# Patient Record
Sex: Female | Born: 2000 | Race: Black or African American | Hispanic: No | Marital: Single | Smoking: Never smoker
Health system: Southern US, Community
[De-identification: ages and names within clinical notes are randomized; demographics above are authoritative.]

## PROBLEM LIST (undated history)

## (undated) HISTORY — PX: MOUTH SURGERY: SHX715

---
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
## 2022-05-15 ENCOUNTER — Other Ambulatory Visit: Payer: Self-pay | Admitting: Adult Health

## 2022-05-15 DIAGNOSIS — Z30011 Encounter for initial prescription of contraceptive pills: Secondary | ICD-10-CM

## 2022-08-05 ENCOUNTER — Other Ambulatory Visit: Payer: Self-pay | Admitting: *Deleted

## 2022-08-05 DIAGNOSIS — Z30011 Encounter for initial prescription of contraceptive pills: Secondary | ICD-10-CM

## 2022-08-08 MED ORDER — NEXTSTELLIS 3-14.2 MG PO TABS: 1.0000 | ORAL_TABLET | Freq: Every day | ORAL | 3 refills | Status: DC

## 2022-09-20 ENCOUNTER — Other Ambulatory Visit: Payer: Self-pay | Admitting: *Deleted

## 2022-12-11 ENCOUNTER — Encounter (HOSPITAL_COMMUNITY): Payer: Self-pay | Admitting: Emergency Medicine

## 2022-12-11 ENCOUNTER — Emergency Department (HOSPITAL_COMMUNITY)
Admission: EM | Admit: 2022-12-11 | Discharge: 2022-12-11 | Disposition: A | Discharge status: Home / Self Care | Payer: Medicaid Other | Attending: Emergency Medicine | Admitting: Emergency Medicine

## 2022-12-11 ENCOUNTER — Other Ambulatory Visit: Payer: Self-pay

## 2022-12-11 DIAGNOSIS — X58XXXA Exposure to other specified factors, initial encounter: Secondary | ICD-10-CM | POA: Insufficient documentation

## 2022-12-11 DIAGNOSIS — K029 Dental caries, unspecified: Secondary | ICD-10-CM | POA: Insufficient documentation

## 2022-12-11 DIAGNOSIS — S025XXA Fracture of tooth (traumatic), initial encounter for closed fracture: Secondary | ICD-10-CM | POA: Insufficient documentation

## 2022-12-11 MED ORDER — PENICILLIN V POTASSIUM 250 MG PO TABS
500.0000 mg | ORAL_TABLET | Freq: Once | ORAL | Status: AC
  Administered 2022-12-11: 500 mg via ORAL
  Filled 2022-12-11: qty 2

## 2022-12-11 MED ORDER — PENICILLIN V POTASSIUM 500 MG PO TABS: 500.0000 mg | ORAL_TABLET | Freq: Four times a day (QID) | ORAL | 0 refills | Status: AC

## 2022-12-11 NOTE — ED Triage Notes (Signed)
Pt c/o one of her front upper teeth broke while eating about 30 min ago. She rinsed with salt water after it broke. Tooth in poor condition.No bleeding currently. Denies pain. No dental insurance and no established Education officer, community. B Ellicia Alix RN

## 2022-12-11 NOTE — ED Provider Notes (Signed)
[name] EMERGENCY DEPARTMENT AT [facility] Provider Note   CSN: [id number] Arrival date & time: 12/11/22  0023     History  Chief Complaint  Patient presents with   Dental Injury    [name] is a 22 y.o. female.  22 year old female who presents ER today secondary to a broken tooth.  Her right upper canine broke off.  She states she has a work done previously and she is post to go back to get some other final procedure which she never got done.  She states it broke off last night.  She has no pain.  No fever.  But also has no dentist.  She is not sure what she should do so she presents here for further evaluation.   Dental Injury       Home Medications Prior to Admission medications   Medication Sig Start Date End Date Taking? Authorizing Provider  penicillin v potassium (VEETID) 500 MG tablet Take 1 tablet (500 mg total) by mouth 4 (four) times daily for 10 days. 12/11/22 12/21/22 Yes Kayo Zion, Barbara Cower, MD  Drospirenone-Estetrol (NEXTSTELLIS) 3-14.2 MG TABS Take 1 tablet by mouth daily. 08/08/22   Myna Hidalgo, DO  Drospirenone-Estetrol 3-14.2 MG TABS Take 1 tablet by mouth daily. 05/11/21   Lazaro Arms, MD  Multiple Vitamin (MULTIVITAMIN) tablet Take 1 tablet by mouth daily.    [provider]      Allergies    Patient has no known allergies.    Review of Systems   Review of Systems  Physical Exam Updated Vital Signs BP 120/77 (BP Location: Right Arm)   Pulse (!) 57   Temp 98.6 F (37 C) (Oral)   Resp 16   Ht 5\' 7"  (1.702 m)   Wt 114.3 kg   LMP 12/01/2022   SpO2 100%   BMI 39.45 kg/m  Physical Exam Vitals and nursing note reviewed.  Constitutional:      Appearance: She is well-developed.  HENT:     Head: Normocephalic and atraumatic.     Comments: Her right upper canine broke off.  Extensive caries underneath it.  Mild gingival edema and erythema but no drainable abscess. Cardiovascular:     Rate and Rhythm: Normal rate and  regular rhythm.  Pulmonary:     Effort: No respiratory distress.     Breath sounds: No stridor.  Abdominal:     General: There is no distension.  Musculoskeletal:     Cervical back: Normal range of motion.  Neurological:     Mental Status: She is alert.     ED Results / Procedures / Treatments   Labs (all labs ordered are listed, but only abnormal results are displayed) Labs Reviewed - No data to display  EKG None  Radiology No results found.  Procedures Procedures    Medications Ordered in ED Medications  penicillin v potassium (VEETID) tablet 500 mg (has no administration in time range)    ED Course/ Medical Decision Making/ A&P                                 Medical Decision Making Risk Prescription drug management.   Will start antibiotics to prevent any infection but otherwise stable for discharge with dental resource guide provided.  Final Clinical Impression(s) / ED Diagnoses Final diagnoses:  Dental caries  Closed fracture of tooth, initial encounter    Rx / DC Orders ED Discharge  Orders          Ordered    penicillin v potassium (VEETID) 500 MG tablet  4 times daily        12/11/22 0414              Yareliz Thorstenson, Barbara Cower, MD 12/11/22 [id number]

## 2023-06-20 ENCOUNTER — Encounter: Payer: Self-pay | Admitting: Obstetrics & Gynecology

## 2023-06-29 ENCOUNTER — Ambulatory Visit (INDEPENDENT_AMBULATORY_CARE_PROVIDER_SITE_OTHER): Admitting: Adult Health

## 2023-06-29 ENCOUNTER — Other Ambulatory Visit (HOSPITAL_COMMUNITY)
Admission: RE | Admit: 2023-06-29 | Discharge: 2023-06-29 | Disposition: A | Discharge status: Home / Self Care | Source: Ambulatory Visit | Attending: Adult Health | Admitting: Adult Health

## 2023-06-29 ENCOUNTER — Encounter: Payer: Self-pay | Admitting: Adult Health

## 2023-06-29 VITALS — BP 122/86 | HR 78 | Ht 68.0 in | Wt 264.5 lb

## 2023-06-29 DIAGNOSIS — Z124 Encounter for screening for malignant neoplasm of cervix: Secondary | ICD-10-CM | POA: Insufficient documentation

## 2023-06-29 DIAGNOSIS — Z3202 Encounter for pregnancy test, result negative: Secondary | ICD-10-CM | POA: Diagnosis not present

## 2023-06-29 DIAGNOSIS — A749 Chlamydial infection, unspecified: Secondary | ICD-10-CM

## 2023-06-29 DIAGNOSIS — N926 Irregular menstruation, unspecified: Secondary | ICD-10-CM

## 2023-06-29 DIAGNOSIS — Z113 Encounter for screening for infections with a predominantly sexual mode of transmission: Secondary | ICD-10-CM

## 2023-06-29 HISTORY — DX: Chlamydial infection, unspecified: A74.9

## 2023-06-29 LAB — POCT URINE PREGNANCY: Preg Test, Ur: NEGATIVE

## 2023-06-29 NOTE — Progress Notes (Signed)
  Subjective:     Patient ID: [name], female   DOB: [birth year], 23 y.o.   MRN: [id number]  HPI [name] is a 23 year old black female, single, G0P0, in complaining of having missed 2 periods. She needs a pap.    Review of Systems Has missed 2 periods Reviewed past medical,surgical, social and family history. Reviewed medications and allergies.     Objective:   Physical Exam BP 122/86 (BP Location: Right Arm, Patient Position: Sitting, Cuff Size: Large)   Pulse 78   Ht 5\' 8"  (1.727 m)   Wt 264 lb 8 oz (120 kg)   LMP 05/03/2023 (Approximate)   BMI 40.22 kg/m  UPT is negative  Skin warm and dry.Pelvic: external genitalia is normal in appearance no lesions, vagina: white discharge without odor,urethra has no lesions or masses noted, cervix:smooth, pap with GC/CHL performed, uterus: normal size, shape and contour, non tender, no masses felt, adnexa: no masses or tenderness noted. Bladder is non tender and no masses felt.    Fall risk is low  Upstream - 06/29/23 1542       Pregnancy Intention Screening   Does the patient want to become pregnant in the next year? No    Does the patient's partner want to become pregnant in the next year? No    Would the patient like to discuss contraceptive options today? No      Contraception Wrap Up   Current Method Female Condom    End Method Female Condom            Examination chaperoned by Alphonso Aschoff LPN  Assessment:     1. Pregnancy examination or test, negative result - POCT urine pregnancy  2. Routine Papanicolaou smear Pap sent Pap in 3 years if negative  - Cytology - PAP( [place])  3. Missed periods (Primary) Has missed 2 periods  Had negative UPT Will check TSH and free T4 - TSH + free T4  4. Screening examination for STD (sexually transmitted disease) Check HIV and RPR GC/CHL on pap - HIV Antibody (routine testing w rflx) - RPR     Plan:     Follow up in 4 weeks to see if has period or not, if not will rx  provera 10 mg 1 daily for 10 days after checking UPT

## 2023-07-01 LAB — HIV ANTIBODY (ROUTINE TESTING W REFLEX): HIV Screen 4th Generation wRfx: NONREACTIVE

## 2023-07-01 LAB — TSH+FREE T4
Free T4: 0.97 ng/dL (ref 0.82–1.77)
TSH: 1.55 u[IU]/mL (ref 0.450–4.500)

## 2023-07-01 LAB — RPR: RPR Ser Ql: NONREACTIVE

## 2023-07-04 ENCOUNTER — Other Ambulatory Visit: Payer: Self-pay | Admitting: Adult Health

## 2023-07-04 DIAGNOSIS — R87612 Low grade squamous intraepithelial lesion on cytologic smear of cervix (LGSIL): Secondary | ICD-10-CM | POA: Insufficient documentation

## 2023-07-04 DIAGNOSIS — A749 Chlamydial infection, unspecified: Secondary | ICD-10-CM | POA: Insufficient documentation

## 2023-07-04 LAB — CYTOLOGY - PAP
Adequacy: ABSENT
Chlamydia: POSITIVE — AB
Comment: NEGATIVE
Comment: NORMAL
Neisseria Gonorrhea: NEGATIVE

## 2023-07-04 MED ORDER — METRONIDAZOLE 500 MG PO TABS: 500.0000 mg | ORAL_TABLET | Freq: Two times a day (BID) | ORAL | 0 refills | Status: DC

## 2023-07-04 MED ORDER — DOXYCYCLINE HYCLATE 100 MG PO TABS: 100.0000 mg | ORAL_TABLET | Freq: Two times a day (BID) | ORAL | 0 refills | Status: DC

## 2023-07-04 NOTE — Progress Notes (Signed)
+  BV on pap will rx flagyl, no sex or alcohol while taking  

## 2023-07-19 ENCOUNTER — Emergency Department (HOSPITAL_COMMUNITY)
Admission: EM | Admit: 2023-07-19 | Discharge: 2023-07-20 | Disposition: A | Discharge status: Home / Self Care | Attending: Emergency Medicine | Admitting: Emergency Medicine

## 2023-07-19 ENCOUNTER — Other Ambulatory Visit: Payer: Self-pay

## 2023-07-19 ENCOUNTER — Encounter (HOSPITAL_COMMUNITY): Payer: Self-pay

## 2023-07-19 ENCOUNTER — Emergency Department (HOSPITAL_COMMUNITY)

## 2023-07-19 DIAGNOSIS — J111 Influenza due to unidentified influenza virus with other respiratory manifestations: Secondary | ICD-10-CM

## 2023-07-19 DIAGNOSIS — J101 Influenza due to other identified influenza virus with other respiratory manifestations: Secondary | ICD-10-CM | POA: Insufficient documentation

## 2023-07-19 DIAGNOSIS — R059 Cough, unspecified: Secondary | ICD-10-CM | POA: Diagnosis present

## 2023-07-19 MED ORDER — ONDANSETRON HCL 4 MG/2ML IJ SOLN: 4.0000 mg | Freq: Once | INTRAMUSCULAR | Status: DC

## 2023-07-19 MED ORDER — ONDANSETRON 4 MG PO TBDP
4.0000 mg | ORAL_TABLET | Freq: Once | ORAL | Status: AC
  Administered 2023-07-19: 4 mg via ORAL
  Filled 2023-07-19: qty 1

## 2023-07-19 MED ORDER — IBUPROFEN 400 MG PO TABS
400.0000 mg | ORAL_TABLET | Freq: Once | ORAL | Status: AC
  Administered 2023-07-19: 400 mg via ORAL
  Filled 2023-07-19: qty 1

## 2023-07-19 NOTE — ED Provider Notes (Signed)
 [place] EMERGENCY DEPARTMENT AT [facility] Provider Note   CSN: [id number] Arrival date & time: 07/19/23  2202     History {Add pertinent medical, surgical, social history, OB history to HPI:1} Chief Complaint  Patient presents with   Emesis    [name] is a 23 y.o. female.  HPI     This is a 23 year old female who presents with upper respiratory symptoms, cough, chills, congestion, headache.  Also reports emesis.  Ongoing for the last 2 to 3 days.  States that she was taking antibiotics for chlamydia infection and seemed to not tolerate antibiotics and would frequently vomit after taking the antibiotics.  She also incidentally just started taking Wegovy.  Over the last several days she has had chills and cough.  Cough is nonproductive.  She has been taking DayQuil and NyQuil with some relief.  No fevers.  No known sick contacts.  Denies chest pain or abdominal pain.  Home Medications Prior to Admission medications   Medication Sig Start Date End Date Taking? Authorizing Provider  metroNIDAZOLE  (FLAGYL ) 500 MG tablet Take 1 tablet (500 mg total) by mouth 2 (two) times daily. 07/04/23   Javan Messing, NP  CRANBERRY PO Take by mouth.    [provider]  doxycycline  (VIBRA -TABS) 100 MG tablet Take 1 tablet (100 mg total) by mouth 2 (two) times daily. 07/04/23   Javan Messing, NP  Multiple Vitamin (MULTIVITAMIN) tablet Take 1 tablet by mouth daily.    [provider]      Allergies    Patient has no known allergies.    Review of Systems   Review of Systems  Constitutional:  Negative for fever.  HENT:  Positive for congestion.   Respiratory:  Positive for cough. Negative for shortness of breath.   Cardiovascular:  Negative for chest pain.  Gastrointestinal:  Positive for nausea and vomiting.  All other systems reviewed and are negative.   Physical Exam Updated Vital Signs BP 127/75 (BP Location: Right Arm)   Pulse 95   Temp  100 F (37.8 C) (Oral)   Resp 18   Ht 1.727 m (5\' 8" )   Wt 120 kg   LMP  (LMP Unknown)   SpO2 94%   BMI 40.22 kg/m  Physical Exam Vitals and nursing note reviewed.  Constitutional:      Appearance: She is well-developed. She is obese. She is not ill-appearing.  HENT:     Head: Normocephalic and atraumatic.     Nose: Congestion present.     Mouth/Throat:     Mouth: Mucous membranes are moist.  Eyes:     Pupils: Pupils are equal, round, and reactive to light.  Cardiovascular:     Rate and Rhythm: Normal rate and regular rhythm.     Heart sounds: Normal heart sounds.  Pulmonary:     Effort: Pulmonary effort is normal. No respiratory distress.     Breath sounds: No wheezing.  Abdominal:     General: Bowel sounds are normal.     Palpations: Abdomen is soft.     Tenderness: There is no abdominal tenderness. There is no guarding or rebound.  Musculoskeletal:     Cervical back: Neck supple.  Skin:    General: Skin is warm and dry.  Neurological:     Mental Status: She is alert and oriented to person, place, and time.  Psychiatric:        Mood and Affect: Mood normal.  ED Results / Procedures / Treatments   Labs (all labs ordered are listed, but only abnormal results are displayed) Labs Reviewed  RESP PANEL BY RT-PCR (RSV, FLU A&B, COVID)  RVPGX2    EKG None  Radiology No results found.  Procedures Procedures  {Document cardiac monitor, telemetry assessment procedure when appropriate:1}  Medications Ordered in ED Medications  ondansetron (ZOFRAN) injection 4 mg (has no administration in time range)  ibuprofen (ADVIL) tablet 400 mg (has no administration in time range)    ED Course/ Medical Decision Making/ A&P   {   Click here for ABCD2, HEART and other calculatorsREFRESH Note before signing :1}                              Medical Decision Making Amount and/or Complexity of Data Reviewed Radiology: ordered.  Risk Prescription drug  management.   ***  {Document critical care time when appropriate:1} {Document review of labs and clinical decision tools ie heart score, Chads2Vasc2 etc:1}  {Document your independent review of radiology images, and any outside records:1} {Document your discussion with family members, caretakers, and with consultants:1} {Document social determinants of health affecting pt's care:1} {Document your decision making why or why not admission, treatments were needed:1} Final Clinical Impression(s) / ED Diagnoses Final diagnoses:  None    Rx / DC Orders ED Discharge Orders     None

## 2023-07-19 NOTE — ED Triage Notes (Signed)
 Past two days pressure behind both eyes. Cold symptoms started about 3 days ago. Thrown up multiple times within 2 days. Headache and dizziness.

## 2023-07-20 LAB — RESP PANEL BY RT-PCR (RSV, FLU A&B, COVID)  RVPGX2
Influenza A by PCR: NEGATIVE
Influenza B by PCR: POSITIVE — AB
Resp Syncytial Virus by PCR: NEGATIVE
SARS Coronavirus 2 by RT PCR: NEGATIVE

## 2023-07-20 MED ORDER — FLUTICASONE PROPIONATE 50 MCG/ACT NA SUSP: 2.0000 | Freq: Every day | NASAL | 2 refills | Status: DC

## 2023-07-20 MED ORDER — ONDANSETRON 4 MG PO TBDP: 4.0000 mg | ORAL_TABLET | Freq: Three times a day (TID) | ORAL | 0 refills | Status: AC | PRN

## 2023-07-20 NOTE — Discharge Instructions (Signed)
 You were seen today and tested positive for the flu.  Make sure that you are staying hydrated.  Take Zofran as needed for nausea and vomiting.  You may take Flonase for congestion.  Tylenol  or ibuprofen for body aches or pains.

## 2023-07-27 ENCOUNTER — Ambulatory Visit: Admitting: Adult Health

## 2023-07-27 ENCOUNTER — Encounter: Payer: Self-pay | Admitting: Adult Health

## 2023-07-27 ENCOUNTER — Other Ambulatory Visit (HOSPITAL_COMMUNITY)
Admission: RE | Admit: 2023-07-27 | Discharge: 2023-07-27 | Disposition: A | Discharge status: Home / Self Care | Source: Ambulatory Visit | Attending: Adult Health | Admitting: Adult Health

## 2023-07-27 VITALS — BP 129/86 | HR 83 | Ht 67.0 in | Wt 256.5 lb

## 2023-07-27 DIAGNOSIS — Z113 Encounter for screening for infections with a predominantly sexual mode of transmission: Secondary | ICD-10-CM | POA: Insufficient documentation

## 2023-07-27 DIAGNOSIS — N911 Secondary amenorrhea: Secondary | ICD-10-CM | POA: Diagnosis not present

## 2023-07-27 DIAGNOSIS — Z8619 Personal history of other infectious and parasitic diseases: Secondary | ICD-10-CM

## 2023-07-27 DIAGNOSIS — R030 Elevated blood-pressure reading, without diagnosis of hypertension: Secondary | ICD-10-CM | POA: Insufficient documentation

## 2023-07-27 DIAGNOSIS — Z3202 Encounter for pregnancy test, result negative: Secondary | ICD-10-CM | POA: Diagnosis not present

## 2023-07-27 LAB — POCT URINE PREGNANCY: Preg Test, Ur: NEGATIVE

## 2023-07-27 MED ORDER — MEDROXYPROGESTERONE ACETATE 10 MG PO TABS: 10.0000 mg | ORAL_TABLET | Freq: Every day | ORAL | 0 refills | Status: AC

## 2023-07-27 NOTE — Progress Notes (Signed)
  Subjective:     Patient ID: [name], female   DOB: 02-08-2001, 23 y.o.   MRN: [id number]  HPI [name] is a 23 year old black female,single, G0P0, in for follow up on no period since end of February, and needs proof of treatment for chlamydia, she did self swab.     Component Value Date/Time   DIAGPAP - Low grade squamous intraepithelial lesion (LSIL) (A) 06/29/2023 1543   ADEQPAP  06/29/2023 1543    Satisfactory for evaluation; transformation zone component ABSENT.    Review of Systems No period since end of February Denies any vaginal discharge  No sex since taking meds Reviewed past medical,surgical, social and family history. Reviewed medications and allergies.     Objective:   Physical Exam BP 129/86 (BP Location: Left Arm, Patient Position: Sitting, Cuff Size: Normal)   Pulse 83   Ht 5\' 7"  (1.702 m)   Wt 256 lb 8 oz (116.3 kg)   LMP 05/03/2023 (Approximate)   BMI 40.17 kg/m  UPT is negative    Skin warm and dry. Lungs: clear to ausculation bilaterally. Cardiovascular: regular rate and rhythm. Pt perforemdd self swab.   Upstream - 07/27/23 1022       Pregnancy Intention Screening   Does the patient want to become pregnant in the next year? No    Does the patient's partner want to become pregnant in the next year? No    Would the patient like to discuss contraceptive options today? No      Contraception Wrap Up   Current Method Abstinence;Female Condom    End Method Abstinence;Female Condom             Assessment:     1. History of chlamydia CV swab sent, no sex since taking meds  - Cervicovaginal ancillary only( [place])  2. Screening examination for STD (sexually transmitted disease) CV swab sent  - Cervicovaginal ancillary only( [place])  3. Pregnancy examination or test, negative result - POCT urine pregnancy  4. Amenorrhea, secondary (Primary) No period since end of February UPT  is negative Rx provera 10 mg 1 daily for 10 days to  see if can get withdrawal bleed Meds ordered this encounter  Medications   medroxyPROGESTERone (PROVERA) 10 MG tablet    Sig: Take 1 tablet (10 mg total) by mouth daily. Take 1 daily for 10 days    Dispense:  10 tablet    Refill:  0    Supervising Provider:   Evalyn Hillier H [[id number]]      5. Elevated BP without diagnosis of hypertension Was normal on recheck  Plan:     Follow up in 3 weeks to see if bleeds or not

## 2023-07-28 LAB — CERVICOVAGINAL ANCILLARY ONLY
Bacterial Vaginitis (gardnerella): NEGATIVE
Candida Glabrata: POSITIVE — AB
Candida Vaginitis: POSITIVE — AB
Chlamydia: NEGATIVE
Comment: NEGATIVE
Comment: NEGATIVE
Comment: NEGATIVE
Comment: NEGATIVE
Comment: NEGATIVE
Comment: NORMAL
Neisseria Gonorrhea: NEGATIVE
Trichomonas: NEGATIVE

## 2023-08-01 ENCOUNTER — Ambulatory Visit: Payer: Self-pay | Admitting: Adult Health

## 2023-08-01 MED ORDER — FLUCONAZOLE 100 MG PO TABS: 100.0000 mg | ORAL_TABLET | Freq: Every day | ORAL | 0 refills | Status: AC

## 2023-08-17 ENCOUNTER — Ambulatory Visit: Admitting: Adult Health

## 2023-09-01 ENCOUNTER — Emergency Department (HOSPITAL_COMMUNITY)
Admission: EM | Admit: 2023-09-01 | Discharge: 2023-09-02 | Disposition: A | Discharge status: Home / Self Care | Attending: Emergency Medicine | Admitting: Emergency Medicine

## 2023-09-01 ENCOUNTER — Encounter (HOSPITAL_COMMUNITY): Payer: Self-pay | Admitting: Emergency Medicine

## 2023-09-01 ENCOUNTER — Other Ambulatory Visit: Payer: Self-pay

## 2023-09-01 DIAGNOSIS — Z202 Contact with and (suspected) exposure to infections with a predominantly sexual mode of transmission: Secondary | ICD-10-CM | POA: Insufficient documentation

## 2023-09-01 NOTE — ED Triage Notes (Signed)
 Pt in with desire for STD screening, states she had unprotected intercourse 2 wks ago. No active symptoms at this time, but partner is reporting some

## 2023-09-02 LAB — URINALYSIS, ROUTINE W REFLEX MICROSCOPIC
Bilirubin Urine: NEGATIVE
Glucose, UA: NEGATIVE mg/dL
Hgb urine dipstick: NEGATIVE
Ketones, ur: NEGATIVE mg/dL
Leukocytes,Ua: NEGATIVE
Nitrite: NEGATIVE
Protein, ur: 30 mg/dL — AB
Specific Gravity, Urine: 1.024 (ref 1.005–1.030)
pH: 5 (ref 5.0–8.0)

## 2023-09-02 LAB — WET PREP, GENITAL
Clue Cells Wet Prep HPF POC: NONE SEEN
Sperm: NONE SEEN
Trich, Wet Prep: NONE SEEN
WBC, Wet Prep HPF POC: <10 (ref <10)

## 2023-09-02 LAB — PREGNANCY, URINE: Preg Test, Ur: NEGATIVE

## 2023-09-02 MED ORDER — AZITHROMYCIN 250 MG PO TABS
1000.0000 mg | ORAL_TABLET | Freq: Once | ORAL | Status: AC
  Administered 2023-09-02: 1000 mg via ORAL
  Filled 2023-09-02: qty 4

## 2023-09-02 MED ORDER — CEFTRIAXONE SODIUM 500 MG IJ SOLR
500.0000 mg | Freq: Once | INTRAMUSCULAR | Status: AC
  Administered 2023-09-02: 500 mg via INTRAMUSCULAR
  Filled 2023-09-02: qty 500

## 2023-09-02 MED ORDER — FLUCONAZOLE 150 MG PO TABS: 150.0000 mg | ORAL_TABLET | Freq: Every day | ORAL | 0 refills | Status: AC

## 2023-09-02 NOTE — ED Provider Notes (Signed)
  [place] EMERGENCY DEPARTMENT AT [facility] Provider Note   CSN: [id number] Arrival date & time: 09/01/23  2205     Patient presents with: STD check   [name] is a 23 y.o. female.   Patient is a 23 year old female presenting with complaints of STD exposure.  Patient tells me that she had unprotected sex 2 weeks ago with another individual.  This individual informed her this evening that they were having burning with urination and that she should probably get checked out.  Patient denies any discharge, dysuria, or abdominal pain.       Prior to Admission medications   Medication Sig Start Date End Date Taking? Authorizing Provider  CRANBERRY PO Take by mouth.    [provider]  medroxyPROGESTERone  (PROVERA ) 10 MG tablet Take 1 tablet (10 mg total) by mouth daily. Take 1 daily for 10 days 07/27/23   Signa Delon LABOR, NP  Multiple Vitamin (MULTIVITAMIN) tablet Take 1 tablet by mouth daily.    [provider]  ondansetron  (ZOFRAN -ODT) 4 MG disintegrating tablet Take 1 tablet (4 mg total) by mouth every 8 (eight) hours as needed for nausea or vomiting. 07/20/23   Horton, Charmaine FALCON, MD  RETIN-A 0.025 % cream Apply topically at bedtime.    [provider]  WEGOVY 0.25 MG/0.5ML SOAJ Inject 0.25 mg into the skin. 07/10/23   [provider]    Allergies: Patient has no known allergies.    Review of Systems  All other systems reviewed and are negative.   Updated Vital Signs BP 126/85   Pulse 79   Temp 98.1 F (36.7 C) (Oral)   Resp 20   Wt 116.3 kg   LMP 07/28/2023 (Approximate)   SpO2 98%   BMI 40.16 kg/m   Physical Exam Vitals and nursing note reviewed.  Constitutional:      Appearance: Normal appearance.  Pulmonary:     Effort: Pulmonary effort is normal.   Skin:    General: Skin is warm and dry.   Neurological:     Mental Status: She is alert and oriented to person, place, and time.     (all labs  ordered are listed, but only abnormal results are displayed) Labs Reviewed  WET PREP, GENITAL  PREGNANCY, URINE  URINALYSIS, ROUTINE W REFLEX MICROSCOPIC  GC/CHLAMYDIA PROBE AMP ([place]) NOT AT [facility]    EKG: None  Radiology: No results found.   Procedures   Medications Ordered in the ED - No data to display                                  Medical Decision Making Amount and/or Complexity of Data Reviewed Labs: ordered.  Risk Prescription drug management.   Patient presenting for possible STD exposure.  Patient requesting testing and treatment.  She will be given IM Rocephin and Zithromax.  Wet prep reveals yeast, but no other significant abnormality.  GC and Chlamydia are pending.     Final diagnoses:  None    ED Discharge Orders     None          Geroldine Berg, MD 09/02/23 [id number]

## 2023-09-02 NOTE — Discharge Instructions (Signed)
 Begin taking Diflucan  as prescribed.  Follow-up the results of your testing on MyChart.  If your tests are positive for gonorrhea or chlamydia, no unprotected sexual contact for 2 weeks and notify any partners so that they can also be treated.

## 2023-09-02 NOTE — ED Notes (Signed)
Went over dc papers. Verbalized understanding. Ambulatory to lobby.  

## 2023-09-05 LAB — GC/CHLAMYDIA PROBE AMP (~~LOC~~) NOT AT ARMC
Chlamydia: NEGATIVE
Comment: NEGATIVE
Comment: NORMAL
Neisseria Gonorrhea: NEGATIVE

## 2023-11-02 ENCOUNTER — Other Ambulatory Visit (HOSPITAL_COMMUNITY)
Admission: RE | Admit: 2023-11-02 | Discharge: 2023-11-02 | Disposition: A | Discharge status: Home / Self Care | Source: Ambulatory Visit | Attending: Obstetrics & Gynecology | Admitting: Obstetrics & Gynecology

## 2023-11-02 ENCOUNTER — Ambulatory Visit

## 2023-11-02 DIAGNOSIS — Z113 Encounter for screening for infections with a predominantly sexual mode of transmission: Secondary | ICD-10-CM | POA: Insufficient documentation

## 2023-11-02 NOTE — Progress Notes (Signed)
   NURSE VISIT- VAGINITIS/STD/POC  SUBJECTIVE:  [name] is a 23 y.o. G0P0000 GYN patientfemale here for a vaginal swab for STD screen.  She reports the following symptoms: none for 0 days. Denies abnormal vaginal bleeding, significant pelvic pain, fever, or UTI symptoms.  OBJECTIVE:  There were no vitals taken for this visit.  Appears well, in no apparent distress  ASSESSMENT: Vaginal swab for STD screen  PLAN: Self-collected vaginal probe for Gonorrhea, Chlamydia, Trichomonas, Bacterial Vaginosis, Yeast sent to lab Treatment: to be determined once results are received Follow-up as needed if symptoms persist/worsen, or new symptoms develop  Alan LITTIE Fischer  11/02/2023 1:41 PM

## 2023-11-03 ENCOUNTER — Ambulatory Visit: Payer: Self-pay | Admitting: Adult Health

## 2023-11-03 LAB — RPR: RPR Ser Ql: NONREACTIVE

## 2023-11-03 LAB — HIV ANTIBODY (ROUTINE TESTING W REFLEX): HIV Screen 4th Generation wRfx: NONREACTIVE

## 2023-11-07 LAB — CERVICOVAGINAL ANCILLARY ONLY
Bacterial Vaginitis (gardnerella): NEGATIVE
Candida Glabrata: POSITIVE — AB
Candida Vaginitis: POSITIVE — AB
Chlamydia: NEGATIVE
Comment: NEGATIVE
Comment: NEGATIVE
Comment: NEGATIVE
Comment: NEGATIVE
Comment: NEGATIVE
Comment: NORMAL
Neisseria Gonorrhea: NEGATIVE
Trichomonas: NEGATIVE

## 2023-11-08 MED ORDER — FLUCONAZOLE 150 MG PO TABS: ORAL_TABLET | ORAL | 1 refills | Status: AC
